# Patient Record
Sex: Female | Born: 1990 | Race: Black or African American | Hispanic: No | Marital: Single | Smoking: Never smoker
Health system: Southern US, Community
[De-identification: ages and names within clinical notes are randomized; demographics above are authoritative.]

## PROBLEM LIST (undated history)

## (undated) ENCOUNTER — Ambulatory Visit: Disposition: A | Discharge status: Home / Self Care | Payer: BLUE CROSS/BLUE SHIELD

## (undated) ENCOUNTER — Ambulatory Visit: Admission: EM | Payer: BC Managed Care – PPO | Source: Home / Self Care

## (undated) ENCOUNTER — Inpatient Hospital Stay (HOSPITAL_COMMUNITY): Payer: Self-pay

## (undated) DIAGNOSIS — E282 Polycystic ovarian syndrome: Secondary | ICD-10-CM

---

## 2012-07-21 ENCOUNTER — Ambulatory Visit (INDEPENDENT_AMBULATORY_CARE_PROVIDER_SITE_OTHER): Payer: BC Managed Care – PPO | Admitting: Family Medicine

## 2012-07-21 VITALS — BP 122/83 | HR 66 | Temp 98.2°F | Resp 16 | Ht 62.6 in | Wt 124.2 lb

## 2012-07-21 DIAGNOSIS — L738 Other specified follicular disorders: Secondary | ICD-10-CM

## 2012-07-21 DIAGNOSIS — Z3009 Encounter for other general counseling and advice on contraception: Secondary | ICD-10-CM

## 2012-07-21 DIAGNOSIS — R590 Localized enlarged lymph nodes: Secondary | ICD-10-CM

## 2012-07-21 DIAGNOSIS — R599 Enlarged lymph nodes, unspecified: Secondary | ICD-10-CM

## 2012-07-21 DIAGNOSIS — L739 Follicular disorder, unspecified: Secondary | ICD-10-CM

## 2012-07-21 DIAGNOSIS — L678 Other hair color and hair shaft abnormalities: Secondary | ICD-10-CM

## 2012-07-21 LAB — POCT URINE PREGNANCY: Preg Test, Ur: NEGATIVE

## 2012-07-21 MED ORDER — DOXYCYCLINE HYCLATE 100 MG PO TABS: 100.0000 mg | ORAL_TABLET | Freq: Two times a day (BID) | ORAL | Status: DC

## 2012-07-21 MED ORDER — NORETHINDRONE ACET-ETHINYL EST 1-20 MG-MCG PO TABS: 1.0000 | ORAL_TABLET | Freq: Every day | ORAL | Status: DC

## 2012-07-21 NOTE — Progress Notes (Signed)
[facility]:  Office Visit  Chief Complaint:  Chief Complaint  Patient presents with  . lumps on neck    HPI: Mackenzie Foley is a 22 y.o. female who complains of an infected bump on the middle of her neck which became infected about 3 days ago, was initially  a black head on neck for a long time, she then picked at it, then she also used a hair shaver for the little hairs on her chin and neck called the "finishing touch"  Without gels to shave the hair on her neck and this might have made the black head infected even more. Her mom was trying to pop it. There was pus when she tried to pop it. Denies fevers, chills. No prior skin infections. + pain, redness, purulent drainage.  She would also like OCP, she is sexually acitve. Uses condoms soemtimes. G0, no STDs that she knows of. One partner.   History reviewed. No pertinent past medical history. History reviewed. No pertinent past surgical history. History   Social History  . Marital Status: Single    Spouse Name: N/A    Number of Children: N/A  . Years of Education: N/A   Social History Main Topics  . Smoking status: Never Smoker   . Smokeless tobacco: None  . Alcohol Use: No  . Drug Use: No  . Sexually Active: Yes   Other Topics Concern  . None   Social History Narrative  . None   Family History  Problem Relation Age of Onset  . Hypertension Mother   . Hypertension Father    No Known Allergies Prior to Admission medications   Not on File     ROS: The patient denies fevers, chills, night sweats, unintentional weight loss, chest pain, palpitations, wheezing, dyspnea on exertion, nausea, vomiting, abdominal pain, dysuria, hematuria, melena, numbness, weakness, or tingling.  All other systems have been reviewed and were otherwise negative with the exception of those mentioned in the HPI and as above.    PHYSICAL EXAM: Filed Vitals:   07/21/12 1639  BP: 122/83  Pulse: 66  Temp: 98.2 F (36.8 C)   Resp: 16   Filed Vitals:   07/21/12 1639  Height: 5' 2.6" (1.59 m)  Weight: 124 lb 3.2 oz (56.337 kg)   Body mass index is 22.28 kg/(m^2).  General: Alert, no acute distress HEENT:  Normocephalic, atraumatic, oropharynx patent.  Cardiovascular:  Regular rate and rhythm, no rubs murmurs or gallops.  No Carotid bruits, radial pulse intact. No pedal edema.  Respiratory: Clear to auscultation bilaterally.  No wheezes, rales, or rhonchi.  No cyanosis, no use of accessory musculature GI: No organomegaly, abdomen is soft and non-tender, positive bowel sounds.  No masses. Skin:+ folliculitis on throat Neurologic: Facial musculature symmetric. Psychiatric: Patient is appropriate throughout our interaction. Lymphatic: + submandibular LAD 4-5 mm, + infected hair follicle on mid neck at [place] area, erythematous nodule, warm, tender, slightly fluctuant Musculoskeletal: Gait intact.   LABS: Results for orders placed in visit on 07/21/12  POCT URINE PREGNANCY      Result Value Range   Preg Test, Ur Negative       EKG/XRAY:   Primary read interpreted by Dr. Conley Rolls at [facility].   ASSESSMENT/PLAN: Encounter Diagnoses  Name Primary?  . Folliculitis Yes  . Lymphadenopathy, submandibular   . Birth control counseling    Rx Doxycycline 100 mg  BID x 10 D Warm compresses to infected follicle Additionally she needs f/u  for her LAD if it continues to be present once she has compelted her abx Rx LeEstrin for OCP, advise to take OCP with condom while on abx.  F/u in 1 year when she turns 22 for pap F/u prn     Jermall Isaacson PHUONG, DO 07/21/2012 5:57 PM

## 2012-08-19 ENCOUNTER — Ambulatory Visit (INDEPENDENT_AMBULATORY_CARE_PROVIDER_SITE_OTHER): Payer: BC Managed Care – PPO | Admitting: Emergency Medicine

## 2012-08-19 VITALS — BP 108/64 | HR 84 | Temp 98.6°F | Resp 16 | Ht 62.0 in | Wt 122.6 lb

## 2012-08-19 DIAGNOSIS — M25559 Pain in unspecified hip: Secondary | ICD-10-CM

## 2012-08-19 LAB — POCT UA - MICROSCOPIC ONLY
Casts, Ur, LPF, POC: NEGATIVE
Crystals, Ur, HPF, POC: NEGATIVE
Yeast, UA: NEGATIVE

## 2012-08-19 LAB — POCT URINALYSIS DIPSTICK
Nitrite, UA: NEGATIVE
Protein, UA: NEGATIVE
Spec Grav, UA: >=1.03
Urobilinogen, UA: 0.2

## 2012-08-19 NOTE — Patient Instructions (Signed)

## 2012-08-19 NOTE — Progress Notes (Signed)
[facility] [address], [place] [address] [phone]  Date:  08/19/2012   Name:  Mackenzie Foley   DOB:  May 14, 1991   MRN:  [id number]  PCP:  No primary provider on file.    Chief Complaint: Abdominal Pain   History of Present Illness:  Mackenzie Foley is a 22 y.o. very pleasant female patient who presents with the following:  Started on OCP 3 weeks ago.  Missed a pill and a week later had a heavy period.  She now is having premenstrual symptoms and only has one pill left.  She is concerned.  She has some low back pain.  No vaginal discharge or dyspareunia  There is no problem list on file for this patient.   History reviewed. No pertinent past medical history.  History reviewed. No pertinent past surgical history.  History  Substance Use Topics  . Smoking status: Never Smoker   . Smokeless tobacco: Not on file  . Alcohol Use: No    Family History  Problem Relation Age of Onset  . Hypertension Mother   . Hypertension Father     No Known Allergies  Medication list has been reviewed and updated.  Current Outpatient Prescriptions on File Prior to Visit  Medication Sig Dispense Refill  . norethindrone-ethinyl estradiol (MICROGESTIN,JUNEL,LOESTRIN) 1-20 MG-MCG tablet Take 1 tablet by mouth daily.  1 Package  11  . doxycycline (VIBRA-TABS) 100 MG tablet Take 1 tablet (100 mg total) by mouth 2 (two) times daily.  20 tablet  0   No current facility-administered medications on file prior to visit.    Review of Systems:  As per HPI, otherwise negative.    Physical Examination: Filed Vitals:   08/19/12 1709  BP: 108/64  Pulse: 84  Temp: 98.6 F (37 C)  Resp: 16   Filed Vitals:   08/19/12 1709  Height: 5\' 2"  (1.575 m)  Weight: 122 lb 9.6 oz (55.611 kg)   Body mass index is 22.42 kg/(m^2). Ideal Body Weight: Weight in (lb) to have BMI = 25: 136.4   GEN: WDWN, NAD, Non-toxic, Alert & Oriented x 3 HEENT: Atraumatic, Normocephalic.   Ears and Nose: No external deformity. EXTR: No clubbing/cyanosis/edema NEURO: Normal gait.  PSYCH: Normally interactive. Conversant. Not depressed or anxious appearing.  Calm demeanor.  Abdomen soft not tender  Assessment and Plan: Dysmenorrhea Counseled regarding contraceptive management   Signed,  Phillips Odor, MD   Results for orders placed in visit on 08/19/12  POCT URINE PREGNANCY      Result Value Range   Preg Test, Ur Negative    POCT UA - MICROSCOPIC ONLY      Result Value Range   WBC, Ur, HPF, POC 0-1     RBC, urine, microscopic 1-3     Bacteria, U Microscopic TRACE     Mucus, UA NEG     Epithelial cells, urine per micros 1-3     Crystals, Ur, HPF, POC NEG     Casts, Ur, LPF, POC NEG     Yeast, UA NEG    POCT URINALYSIS DIPSTICK      Result Value Range   Color, UA YELLOW     Clarity, UA CLOUDY     Glucose, UA NEG     Bilirubin, UA NEG     Ketones, UA NEG     Spec Grav, UA >=1.030     Blood, UA NEG     pH, UA 6.0  Protein, UA NEG     Urobilinogen, UA 0.2     Nitrite, UA NEG     Leukocytes, UA Trace

## 2012-11-13 ENCOUNTER — Encounter (HOSPITAL_COMMUNITY): Payer: Self-pay | Admitting: *Deleted

## 2012-11-13 ENCOUNTER — Ambulatory Visit (INDEPENDENT_AMBULATORY_CARE_PROVIDER_SITE_OTHER): Payer: BC Managed Care – PPO | Admitting: Family Medicine

## 2012-11-13 ENCOUNTER — Inpatient Hospital Stay (HOSPITAL_COMMUNITY)
Admission: AD | Admit: 2012-11-13 | Discharge: 2012-11-13 | Disposition: A | Discharge status: Home / Self Care | Payer: BC Managed Care – PPO | Source: Ambulatory Visit | Attending: Obstetrics & Gynecology | Admitting: Obstetrics & Gynecology

## 2012-11-13 ENCOUNTER — Inpatient Hospital Stay (HOSPITAL_COMMUNITY): Payer: BC Managed Care – PPO

## 2012-11-13 VITALS — BP 106/68 | HR 89 | Temp 98.6°F | Resp 16 | Ht 62.5 in | Wt 120.6 lb

## 2012-11-13 DIAGNOSIS — N920 Excessive and frequent menstruation with regular cycle: Secondary | ICD-10-CM

## 2012-11-13 DIAGNOSIS — O039 Complete or unspecified spontaneous abortion without complication: Secondary | ICD-10-CM

## 2012-11-13 LAB — POCT CBC
Granulocyte percent: 90.4 %G — AB (ref 37–80)
HCT, POC: 42.8 % (ref 37.7–47.9)
Hemoglobin: 13.3 g/dL (ref 12.2–16.2)
Lymph, poc: 1.3 (ref 0.6–3.4)
MCH, POC: 28.3 pg (ref 27–31.2)
MCHC: 31.1 g/dL — AB (ref 31.8–35.4)
MCV: 91.1 fL (ref 80–97)
MID (cbc): 0.4 (ref 0–0.9)
MPV: 11.9 fL (ref 0–99.8)
POC Granulocyte: 16.4 — AB (ref 2–6.9)
POC LYMPH PERCENT: 7.4 %L — AB (ref 10–50)
POC MID %: 2.2 %M (ref 0–12)
Platelet Count, POC: 208 10*3/uL (ref 142–424)
RBC: 4.7 M/uL (ref 4.04–5.48)
RDW, POC: 13.2 %
WBC: 18.1 10*3/uL — AB (ref 4.6–10.2)

## 2012-11-13 LAB — CBC
MCH: 28.2 pg (ref 26.0–34.0)
MCV: 85 fL (ref 78.0–100.0)
Platelets: 198 10*3/uL (ref 150–400)
RDW: 12.4 % (ref 11.5–15.5)

## 2012-11-13 LAB — POCT URINE PREGNANCY: Preg Test, Ur: POSITIVE

## 2012-11-13 MED ORDER — PROMETHAZINE HCL 12.5 MG PO TABS: 12.5000 mg | ORAL_TABLET | Freq: Four times a day (QID) | ORAL | Status: DC | PRN

## 2012-11-13 MED ORDER — OXYCODONE-ACETAMINOPHEN 5-325 MG PO TABS: 2.0000 | ORAL_TABLET | ORAL | Status: DC | PRN

## 2012-11-13 MED ORDER — MISOPROSTOL 200 MCG PO TABS: ORAL_TABLET | ORAL | Status: DC

## 2012-11-13 NOTE — Patient Instructions (Addendum)
Miscarriage A miscarriage is the sudden loss of an unborn baby (fetus) before the 20th week of pregnancy. Most miscarriages happen in the first 3 months of pregnancy. Sometimes, it happens before a woman even knows she is pregnant. A miscarriage is also called a "spontaneous miscarriage" or "early pregnancy loss." Having a miscarriage can be an emotional experience. Talk with your caregiver about any questions you may have about miscarrying, the grieving process, and your future pregnancy plans. CAUSES   Problems with the fetal chromosomes that make it impossible for the baby to develop normally. Problems with the baby's genes or chromosomes are most often the result of errors that occur, by chance, as the embryo divides and grows. The problems are not inherited from the parents.  Infection of the cervix or uterus.   Hormone problems.   Problems with the cervix, such as having an incompetent cervix. This is when the tissue in the cervix is not strong enough to hold the pregnancy.   Problems with the uterus, such as an abnormally shaped uterus, uterine fibroids, or congenital abnormalities.   Certain medical conditions.   Smoking, drinking alcohol, or taking illegal drugs.   Trauma.  Often, the cause of a miscarriage is unknown.  SYMPTOMS   Vaginal bleeding or spotting, with or without cramps or pain.  Pain or cramping in the abdomen or lower back.  Passing fluid, tissue, or blood clots from the vagina. DIAGNOSIS  Your caregiver will perform a physical exam. You may also have an ultrasound to confirm the miscarriage. Blood or urine tests may also be ordered. TREATMENT   Sometimes, treatment is not necessary if you naturally pass all the fetal tissue that was in the uterus. If some of the fetus or placenta remains in the body (incomplete miscarriage), tissue left behind may become infected and must be removed. Usually, a dilation and curettage (D and C) procedure is performed.  During a D and C procedure, the cervix is widened (dilated) and any remaining fetal or placental tissue is gently removed from the uterus.  Antibiotic medicines are prescribed if there is an infection. Other medicines may be given to reduce the size of the uterus (contract) if there is a lot of bleeding.  If you have Rh negative blood and your baby was Rh positive, you will need a Rh immunoglobulin shot. This shot will protect any future baby from having Rh blood problems in future pregnancies. HOME CARE INSTRUCTIONS   Your caregiver may order bed rest or may allow you to continue light activity. Resume activity as directed by your caregiver.  Have someone help with home and family responsibilities during this time.   Keep track of the number of sanitary pads you use each day and how soaked (saturated) they are. Write down this information.   Do not use tampons. Do not douche or have sexual intercourse until approved by your caregiver.   Only take over-the-counter or prescription medicines for pain or discomfort as directed by your caregiver.   Do not take aspirin. Aspirin can cause bleeding.   Keep all follow-up appointments with your caregiver.   If you or your partner have problems with grieving, talk to your caregiver or seek counseling to help cope with the pregnancy loss. Allow enough time to grieve before trying to get pregnant again.  SEEK IMMEDIATE MEDICAL CARE IF:   You have severe cramps or pain in your back or abdomen.  You have a fever.  You pass large blood clots (walnut-sized   or larger) ortissue from your vagina. Save any tissue for your caregiver to inspect.   Your bleeding increases.   You have a thick, bad-smelling vaginal discharge.  You become lightheaded, weak, or you faint.   You have chills.  MAKE SURE YOU:  Understand these instructions.  Will watch your condition.  Will get help right away if you are not doing well or get  worse. Document Released: 11/04/2000 Document Revised: 11/10/2011 Document Reviewed: 06/30/2011 Exit[employer] Patient Information 2014 [employer].  

## 2012-11-13 NOTE — MAU Note (Deleted)
[facility] called for transport. 

## 2012-11-13 NOTE — MAU Provider Note (Signed)
History     CSN: [id number]  Arrival date and time: 11/13/12 1537   First Provider Initiated Contact with Patient 11/13/12 1604      Chief Complaint  Patient presents with  . Threatened Miscarriage   HPI Ms. Mackenzie Foley is a 22 y.o. G1P0 at Unknown who was sent to MAU today from [facility] urgent care for evaluation of bleeding in early pregnancy. The patient states that she was not aware that she was pregnant prior to evaluation in urgent care today. She states moderate lower abdominal cramping off and on today. She started spotting on Friday. Bleeding became heavy today with clots. LMP was 10/11/12 per patient, however this period was shorter and lighter than usual.    OB History   Grav Para Term Preterm Abortions TAB SAB Ect Mult Living   1               History reviewed. No pertinent past medical history.  History reviewed. No pertinent past surgical history.  Family History  Problem Relation Age of Onset  . Hypertension Mother   . Hypertension Father     History  Substance Use Topics  . Smoking status: Never Smoker   . Smokeless tobacco: Not on file  . Alcohol Use: No    Allergies: No Known Allergies  Prescriptions prior to admission  Medication Sig Dispense Refill  . norethindrone-ethinyl estradiol (MICROGESTIN,JUNEL,LOESTRIN) 1-20 MG-MCG tablet Take 1 tablet by mouth daily.  1 Package  11    Review of Systems  Constitutional: Negative for fever and malaise/fatigue.  Gastrointestinal: Positive for abdominal pain. Negative for nausea and vomiting.  Genitourinary:       + vaginal bleeding  Neurological: Negative for dizziness, loss of consciousness and weakness.   Physical Exam   Blood pressure 126/75, pulse 86, temperature 98.4 F (36.9 C), temperature source Oral, resp. rate 18, height 5' 2.5" (1.588 m), weight 120 lb 6.4 oz (54.613 kg), last menstrual period 10/11/2012.  Physical Exam  Constitutional: She is oriented to person, place, and time. She appears  well-developed and well-nourished. No distress.  HENT:  Head: Normocephalic and atraumatic.  Cardiovascular: Normal rate, regular rhythm and normal heart sounds.   Respiratory: Effort normal and breath sounds normal. No respiratory distress.  GI: Soft. Bowel sounds are normal. She exhibits no distension and no mass. There is tenderness (mild tenderness to palpation of the lower abdomen). There is no rebound and no guarding.  Genitourinary: Uterus is enlarged. Uterus is not tender. Cervix exhibits no discharge and no friability. There is bleeding (small amount of bleeding noted. 8cm x 4 cm POC removed from vagina) around the vagina. No vaginal discharge found.  Neurological: She is alert and oriented to person, place, and time.  Skin: Skin is warm and dry. No erythema.  Psychiatric: She has a normal mood and affect.   Results for orders placed during the hospital encounter of 11/13/12 (from the past 24 hour(s))  WET PREP, GENITAL     Status: Abnormal   Collection Time    11/13/12  4:30 PM      Result Value Range   Yeast Wet Prep HPF POC NONE SEEN  NONE SEEN   Trich, Wet Prep NONE SEEN  NONE SEEN   Clue Cells Wet Prep HPF POC NONE SEEN  NONE SEEN   WBC, Wet Prep HPF POC FEW (*) NONE SEEN  CBC     Status: Abnormal   Collection Time    11/13/12  4:45 PM  Result Value Range   WBC 17.6 (*) 4.0 - 10.5 K/uL   RBC 4.26  3.87 - 5.11 MIL/uL   Hemoglobin 12.0  12.0 - 15.0 g/dL   HCT 16.1  09.6 - 04.5 %   MCV 85.0  78.0 - 100.0 fL   MCH 28.2  26.0 - 34.0 pg   MCHC 33.1  30.0 - 36.0 g/dL   RDW 40.9  81.1 - 91.4 %   Platelets 198  150 - 400 K/uL  HCG, QUANTITATIVE, PREGNANCY     Status: Abnormal   Collection Time    11/13/12  4:45 PM      Result Value Range   hCG, Beta Chain, Quant, S 860 (*) <5 mIU/mL  ABO/RH     Status: None   Collection Time    11/13/12  4:45 PM      Result Value Range   ABO/RH(D) O POS      MAU Course  Procedures None  MDM Discussed [place] results with Dr.  Penne Lash. Offer Cytotec and follow-up in clinic in 2-3 weeks. Discuss bleeding precautions POC sent to pathology  Assessment and Plan  A: SAB  P: Discharge home Rx for Cytotec, Percocet and phenergan given/sent to patient's pharmacy Discussed bleeding precautions Patient referred to [facility] clinic for follow-up in 2-3 weeks Patient may return to MAU as needed or if her condition were to change or worsen  Freddi Starr, PA-C  11/13/2012, 4:04 PM

## 2012-11-13 NOTE — MAU Note (Signed)
Pt seen at urgent care for vag bleeding. Told to come to MAU she may be having a miscarriage. Pt reports some cramping on and off.Marland Kitchen

## 2012-11-13 NOTE — Progress Notes (Signed)
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 She has had increasing pain as well today. No fever. She's been sexually active and not using contraception. She says that the period came on at the time she expected, the last period being this time last month. She's had no loss of consciousness or one-sided pain.  Objective: No acute distress Vaginal exam reveals large amount of violaceous tissue issuing forth from the cervix. The area was swabbed and no active bleeding was noted. She did not have cervical motion tenderness and only mild abdominal tenderness.    Results for orders placed in visit on 11/13/12  POCT URINE PREGNANCY      Result Value Range   Preg Test, Ur Positive    POCT CBC      Result Value Range   WBC 18.1 (*) 4.6 - 10.2 K/uL   Lymph, poc 1.3  0.6 - 3.4   POC LYMPH PERCENT 7.4 (*) 10 - 50 %L   MID (cbc) 0.4  0 - 0.9   POC MID % 2.2  0 - 12 %M   POC Granulocyte 16.4 (*) 2 - 6.9   Granulocyte percent 90.4 (*) 37 - 80 %G   RBC 4.70  4.04 - 5.48 M/uL   Hemoglobin 13.3  12.2 - 16.2 g/dL   HCT, POC 13.0  86.5 - 47.9 %   MCV 91.1  80 - 97 fL   MCH, POC 28.3  27 - 31.2 pg   MCHC 31.1 (*) 31.8 - 35.4 g/dL   RDW, POC 78.4     Platelet Count, POC 208  142 - 424 K/uL   MPV 11.9  0 - 99.8 fL   Assessment: Spontaneous abortion in progress, patient stable  Plan[facility] who is present in the exam room take the patient.  Signed, Sheila Oats.D.

## 2012-11-14 LAB — GC/CHLAMYDIA PROBE AMP
CT Probe RNA: NEGATIVE
GC Probe RNA: NEGATIVE

## 2012-11-18 ENCOUNTER — Encounter: Payer: Self-pay | Admitting: Family

## 2012-11-21 NOTE — MAU Provider Note (Signed)
POC on pathology Attestation of Attending Supervision of Advanced Practitioner (CNM/NP): Evaluation and management procedures were performed by the Advanced Practitioner under my supervision and collaboration. I have reviewed the Advanced Practitioner's note and chart, and I agree with the management and plan.  Cooper Moroney H. 10:11 PM

## 2012-11-30 ENCOUNTER — Ambulatory Visit (INDEPENDENT_AMBULATORY_CARE_PROVIDER_SITE_OTHER): Payer: BC Managed Care – PPO | Admitting: Family

## 2012-11-30 ENCOUNTER — Encounter: Payer: Self-pay | Admitting: Family

## 2012-11-30 VITALS — BP 108/74 | HR 60 | Temp 97.4°F | Ht 62.0 in | Wt 123.0 lb

## 2012-11-30 DIAGNOSIS — O039 Complete or unspecified spontaneous abortion without complication: Secondary | ICD-10-CM

## 2012-11-30 NOTE — Progress Notes (Signed)
  Subjective:    Patient ID: Hanah Moultry, female    DOB: 10/04/1990, 22 y.o.   MRN: [id number]  HPI Pt is here status post a SAB.  Seen in MAU on 11/13/12 and large POC removed from vaginal vault.  Sent home on Cytotec.  Reports bleeding stopped one week after cytotec.  No problems or concerns today.  Desires to be on ocps.  Reports has not been sexually active since seen in MAU.   Review of Systems Pertinent info in HPI    Objective:   Physical Exam  Constitutional: She is oriented to person, place, and time. She appears well-developed and well-nourished. No distress.  HENT:  Head: Normocephalic and atraumatic.  Neck: Normal range of motion. Neck supple.  Cardiovascular: Normal rate, regular rhythm and normal heart sounds.   Pulmonary/Chest: Effort normal and breath sounds normal. No respiratory distress.  Abdominal: Soft. Bowel sounds are normal. There is no tenderness.  Uterus involuted  Genitourinary: No bleeding around the vagina.  Neurological: She is alert and oriented to person, place, and time.  Skin: Skin is warm and dry.     Report:  Pathology > 11/13/12 showed products of conception.     Assessment & Plan:  Miscarriage   Plan: Obtain BHCG  RX Loestrin FE 1/20  [facility]

## 2012-12-01 NOTE — Progress Notes (Signed)
BHCG level 10 > repeat in one week.

## 2013-08-05 ENCOUNTER — Ambulatory Visit: Payer: Self-pay

## 2013-08-05 DIAGNOSIS — N898 Other specified noninflammatory disorders of vagina: Secondary | ICD-10-CM

## 2013-08-05 DIAGNOSIS — A599 Trichomoniasis, unspecified: Secondary | ICD-10-CM

## 2013-08-05 DIAGNOSIS — Z202 Contact with and (suspected) exposure to infections with a predominantly sexual mode of transmission: Secondary | ICD-10-CM

## 2014-03-26 ENCOUNTER — Encounter: Payer: Self-pay | Admitting: Family

## 2014-03-30 ENCOUNTER — Other Ambulatory Visit: Payer: Self-pay | Admitting: Obstetrics and Gynecology

## 2014-05-11 ENCOUNTER — Other Ambulatory Visit: Payer: Self-pay | Admitting: Obstetrics and Gynecology

## 2014-05-29 IMAGING — US US OB COMP LESS 14 WK
1 series · 14 of 28 positions shown · non-contrast
Comparison: none

CLINICAL DATA: Vaginal bleeding and abdominal pain

OBSTETRIC <14 WK ULTRASOUND, TRANSVAGINAL OB US
TECHNIQUE: Transabdominal and transvaginal ultrasound was
performed for evaluation of the gestation as well as the maternal
uterus and adnexal regions.

[Series 1: us ob comp less 14 wks · 14 of 31 slices shown]
[im 2/31]
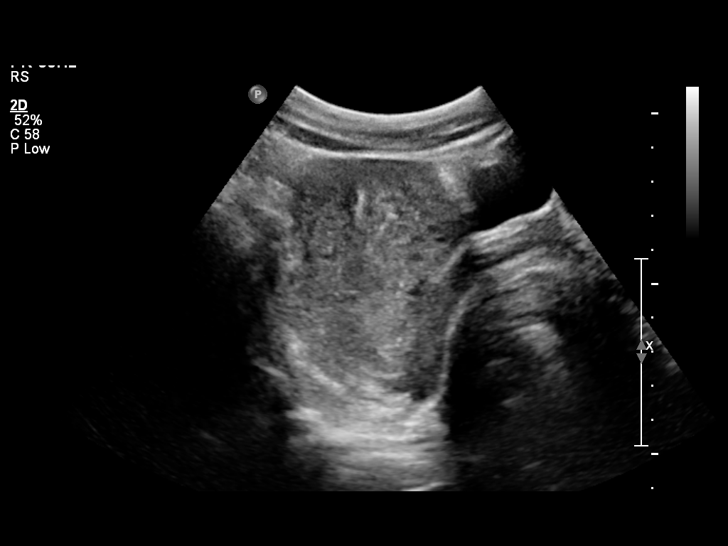
[im 4/31]
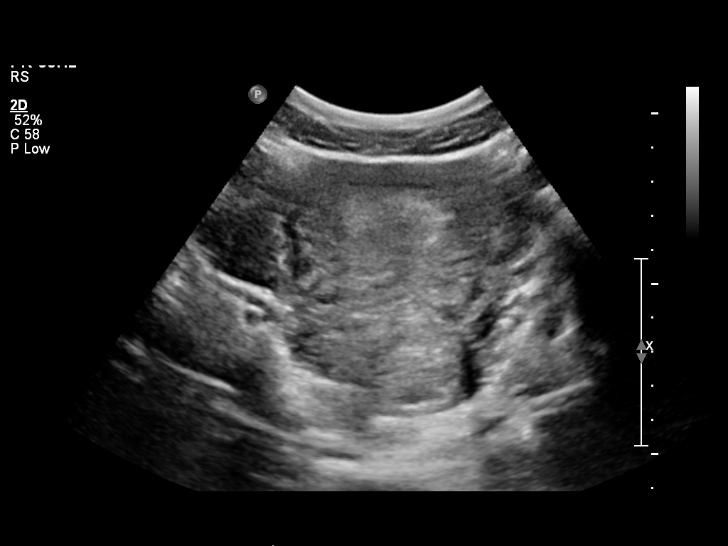
[im 6/31]
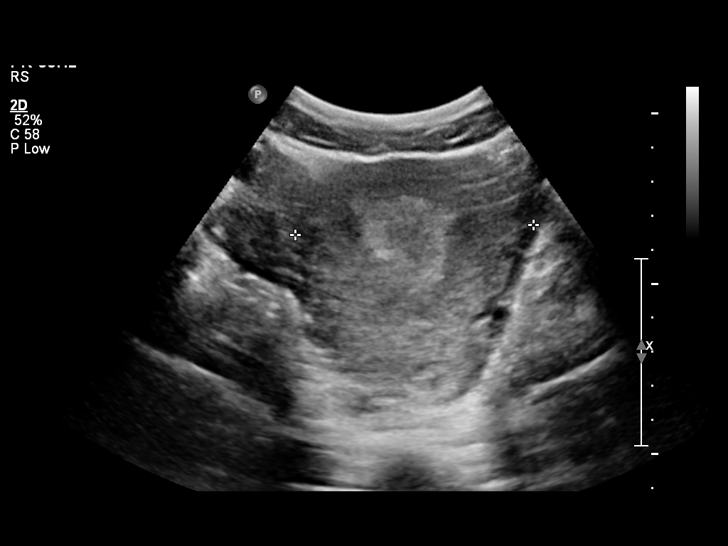
[im 8/31]
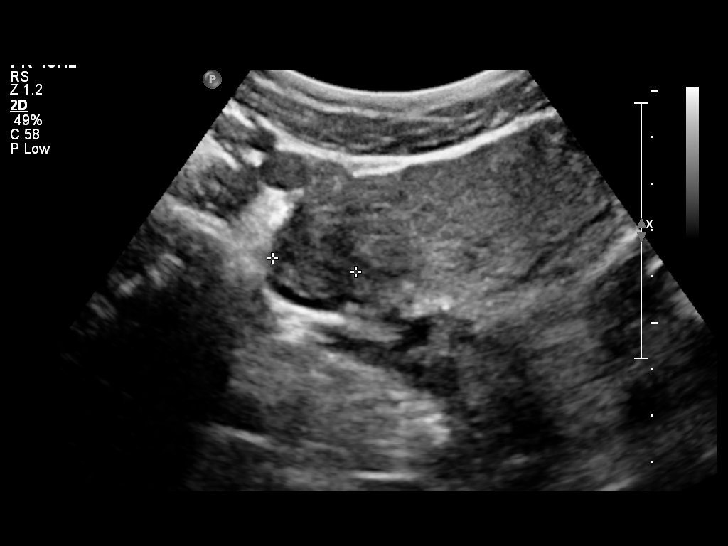
[im 11/31]
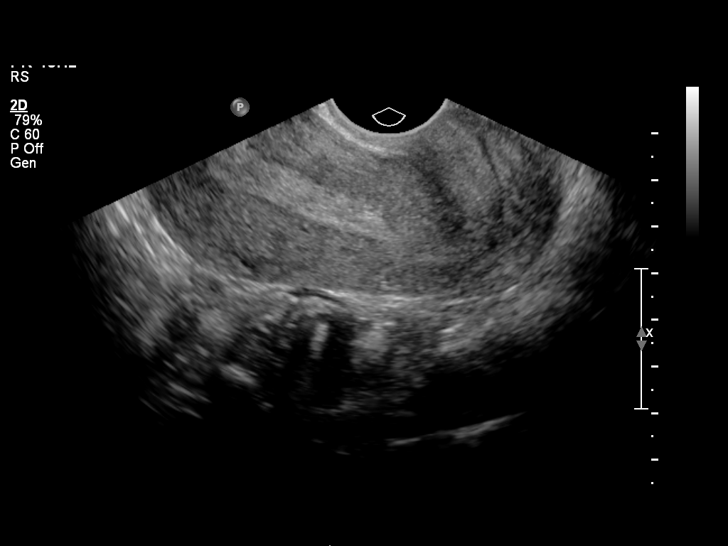
[im 13/31]
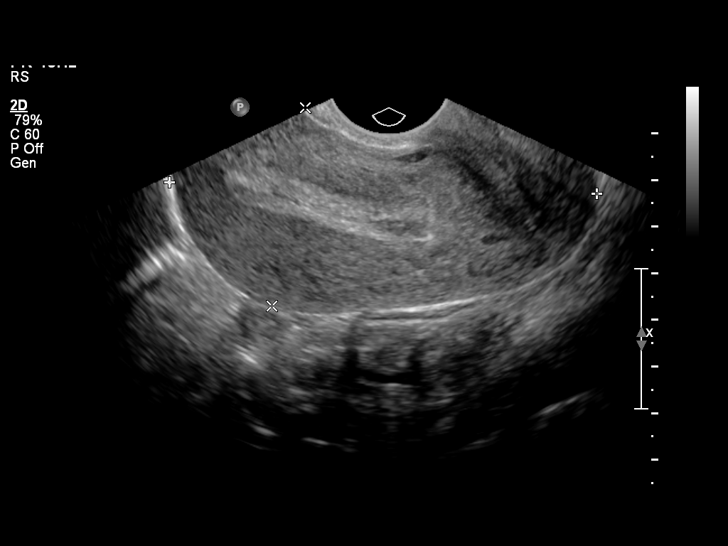
[im 15/31]
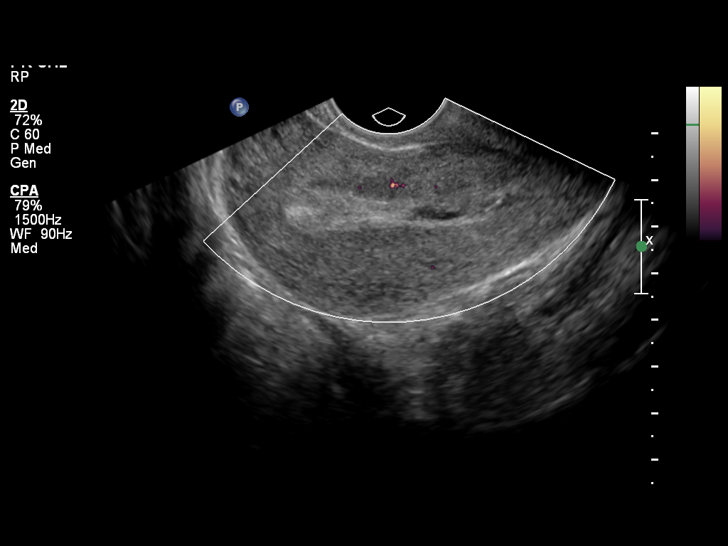
[im 17/31]
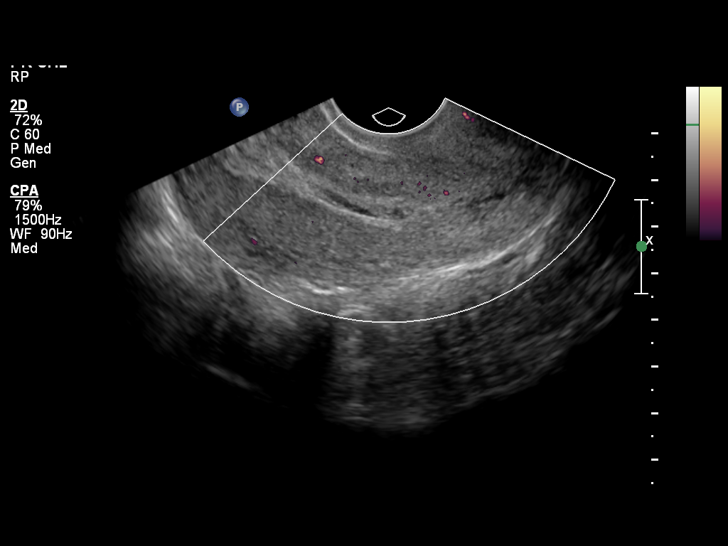
[im 19/31]
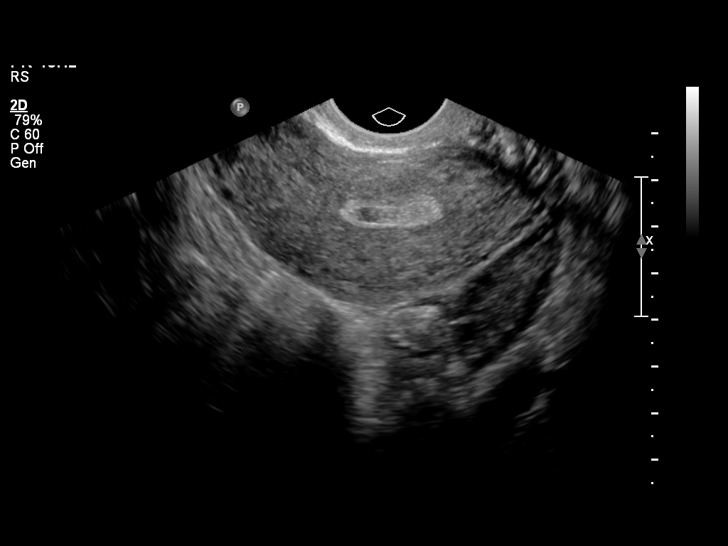
[im 22/31]
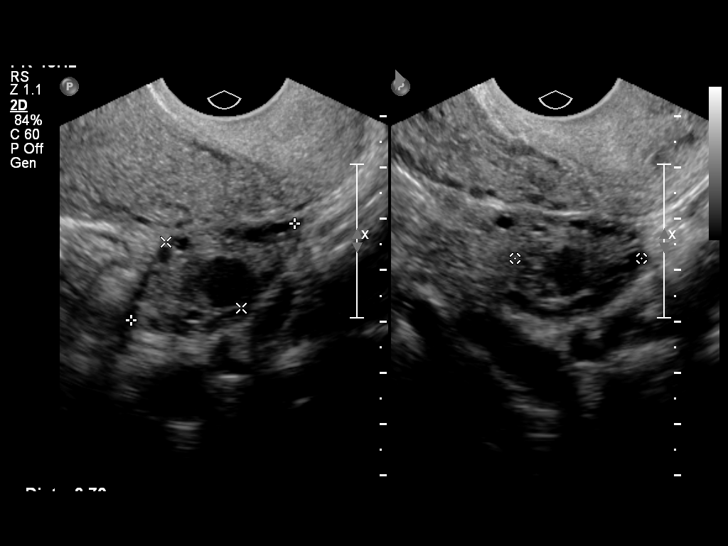
[im 24/31]
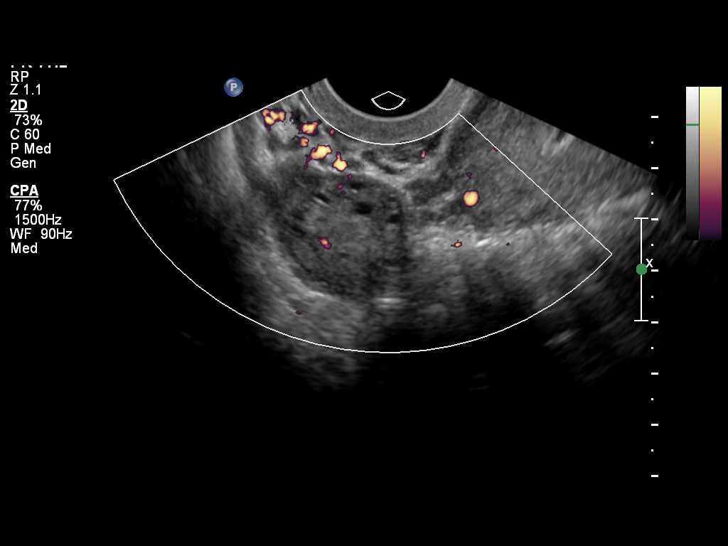
[im 26/31]
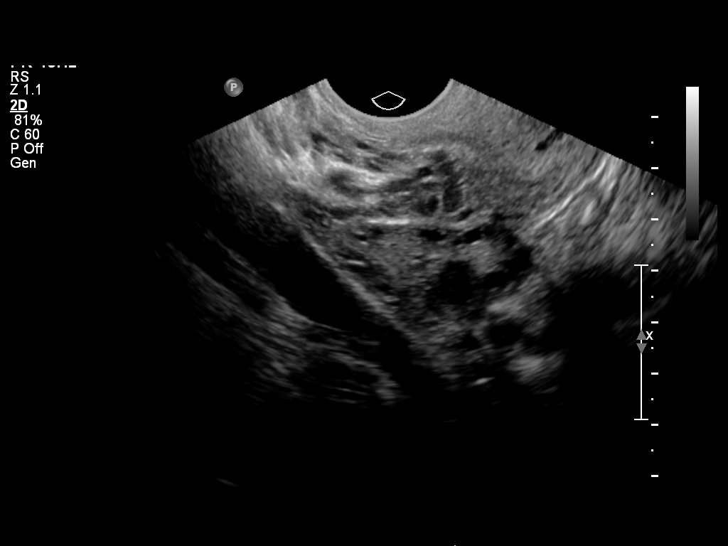
[im 28/31]
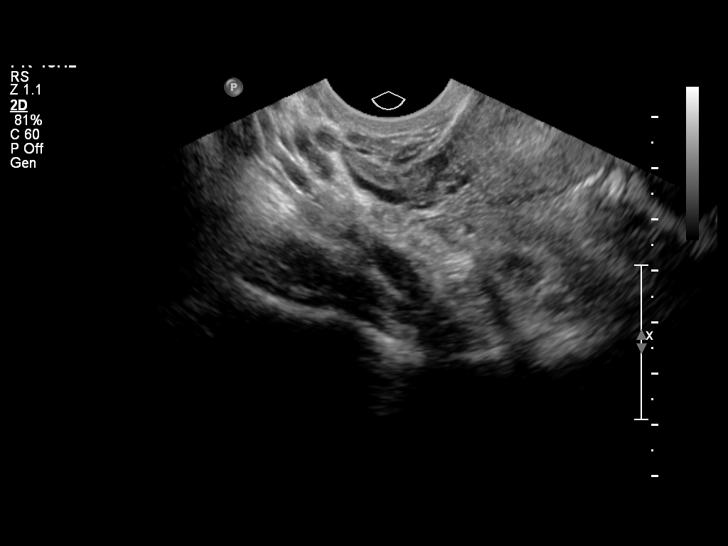
[im 31/31]
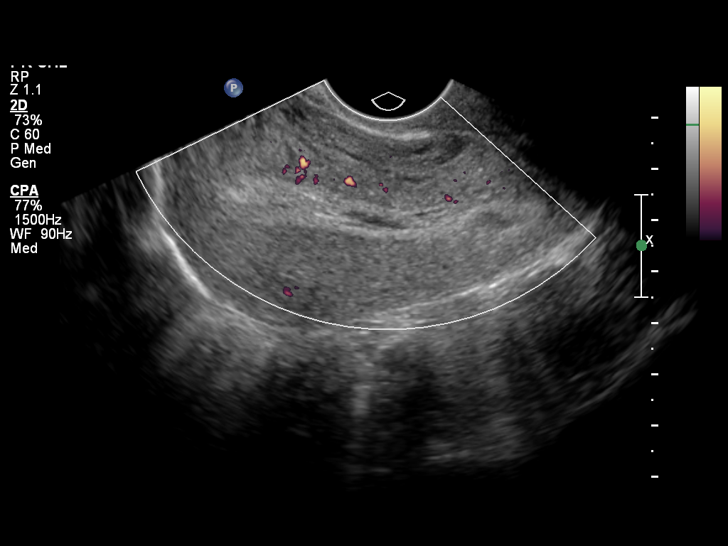

[14 of 28 positions shown; findings below may reference images not displayed]

FINDINGS: No intrauterine gestation is identified.

The endometrium appears irregular measuring up to 8 mm.  No areas
of abnormal vascularity noted.

Maternal uterus/adnexae:
The ovaries both appear normal.
IMPRESSION: 1.  No intrauterine gestational sac identified.
2.  The endometrium measures 8 mm and is mildly complex with areas
of increased and decreased echogenicity..

## 2014-06-27 ENCOUNTER — Other Ambulatory Visit: Payer: Self-pay

## 2014-06-29 LAB — CYTOLOGY - PAP

## 2014-08-01 ENCOUNTER — Other Ambulatory Visit: Payer: Self-pay

## 2014-08-30 ENCOUNTER — Other Ambulatory Visit: Payer: Self-pay

## 2015-03-26 ENCOUNTER — Other Ambulatory Visit: Payer: Self-pay

## 2015-05-26 DIAGNOSIS — E282 Polycystic ovarian syndrome: Secondary | ICD-10-CM

## 2015-05-26 HISTORY — DX: Polycystic ovarian syndrome: E28.2

## 2016-08-23 ENCOUNTER — Encounter (HOSPITAL_COMMUNITY): Payer: Self-pay | Admitting: Emergency Medicine

## 2016-08-23 DIAGNOSIS — R1084 Generalized abdominal pain: Secondary | ICD-10-CM | POA: Insufficient documentation

## 2016-08-23 DIAGNOSIS — A09 Infectious gastroenteritis and colitis, unspecified: Secondary | ICD-10-CM | POA: Diagnosis not present

## 2016-08-23 DIAGNOSIS — R1013 Epigastric pain: Secondary | ICD-10-CM | POA: Diagnosis present

## 2016-08-23 LAB — CBC WITH DIFFERENTIAL/PLATELET
Basophils Absolute: 0 10*3/uL (ref 0.0–0.1)
Basophils Relative: 0 %
EOS PCT: 0 %
Eosinophils Absolute: 0 10*3/uL (ref 0.0–0.7)
HEMATOCRIT: 40.1 % (ref 36.0–46.0)
HEMOGLOBIN: 13.2 g/dL (ref 12.0–15.0)
LYMPHS PCT: 6 %
Lymphs Abs: 1.1 10*3/uL (ref 0.7–4.0)
MCH: 26.9 pg (ref 26.0–34.0)
MCHC: 32.9 g/dL (ref 30.0–36.0)
MCV: 81.7 fL (ref 78.0–100.0)
Monocytes Absolute: 1 10*3/uL (ref 0.1–1.0)
Monocytes Relative: 6 %
Neutro Abs: 14.6 10*3/uL — ABNORMAL HIGH (ref 1.7–7.7)
Neutrophils Relative %: 88 %
Platelets: 280 10*3/uL (ref 150–400)
RBC: 4.91 MIL/uL (ref 3.87–5.11)
RDW: 12.6 % (ref 11.5–15.5)
WBC: 16.7 10*3/uL — AB (ref 4.0–10.5)

## 2016-08-23 LAB — I-STAT BETA HCG BLOOD, ED (MC, WL, AP ONLY): I-stat hCG, quantitative: <5 m[IU]/mL (ref <5)

## 2016-08-23 NOTE — ED Triage Notes (Signed)
Patient complaining of abdominal pain that started today. Patient states it feels like someone is stabbing her in the middle of her abdomen. Patient states that she also feels nauseas.

## 2016-08-24 ENCOUNTER — Emergency Department (HOSPITAL_COMMUNITY)
Admission: EM | Admit: 2016-08-24 | Discharge: 2016-08-24 | Disposition: A | Discharge status: Home / Self Care | Payer: BLUE CROSS/BLUE SHIELD | Attending: Emergency Medicine | Admitting: Emergency Medicine

## 2016-08-24 DIAGNOSIS — R1084 Generalized abdominal pain: Secondary | ICD-10-CM

## 2016-08-24 DIAGNOSIS — R197 Diarrhea, unspecified: Secondary | ICD-10-CM

## 2016-08-24 LAB — URINALYSIS, ROUTINE W REFLEX MICROSCOPIC
BILIRUBIN URINE: NEGATIVE
GLUCOSE, UA: NEGATIVE mg/dL
Ketones, ur: NEGATIVE mg/dL
LEUKOCYTES UA: NEGATIVE
Nitrite: NEGATIVE
PH: 5 (ref 5.0–8.0)
Protein, ur: NEGATIVE mg/dL
SPECIFIC GRAVITY, URINE: 1.027 (ref 1.005–1.030)

## 2016-08-24 LAB — COMPREHENSIVE METABOLIC PANEL
ALBUMIN: 4.5 g/dL (ref 3.5–5.0)
ALK PHOS: 81 U/L (ref 38–126)
ALT: 29 U/L (ref 14–54)
ANION GAP: 7 (ref 5–15)
AST: 26 U/L (ref 15–41)
BUN: 16 mg/dL (ref 6–20)
CALCIUM: 9.5 mg/dL (ref 8.9–10.3)
CO2: 27 mmol/L (ref 22–32)
Chloride: 103 mmol/L (ref 101–111)
Creatinine, Ser: 0.86 mg/dL (ref 0.44–1.00)
GFR calc Af Amer: >60 mL/min (ref >60)
GLUCOSE: 130 mg/dL — AB (ref 65–99)
POTASSIUM: 4 mmol/L (ref 3.5–5.1)
Sodium: 137 mmol/L (ref 135–145)
TOTAL PROTEIN: 8.1 g/dL (ref 6.5–8.1)
Total Bilirubin: 0.8 mg/dL (ref 0.3–1.2)

## 2016-08-24 LAB — LIPASE, BLOOD: Lipase: 24 U/L (ref 11–51)

## 2016-08-24 MED ORDER — IBUPROFEN 600 MG PO TABS: 600.0000 mg | ORAL_TABLET | Freq: Four times a day (QID) | ORAL | 0 refills | Status: AC | PRN

## 2016-08-24 MED ORDER — HYDROCODONE-ACETAMINOPHEN 5-325 MG PO TABS
1.0000 | ORAL_TABLET | Freq: Once | ORAL | Status: AC
  Administered 2016-08-24: 1 via ORAL
  Filled 2016-08-24: qty 1

## 2016-08-24 MED ORDER — IBUPROFEN 200 MG PO TABS
600.0000 mg | ORAL_TABLET | Freq: Once | ORAL | Status: AC
  Administered 2016-08-24: 600 mg via ORAL
  Filled 2016-08-24: qty 3

## 2016-08-24 NOTE — ED Provider Notes (Signed)
[facility]-EMERGENCY DEPT Provider Note   CSN: [id number] Arrival date & time: 08/23/16  2259   By signing my name below, I, Clarisse Gouge, attest that this documentation has been prepared under the direction and in the presence of Zadie Rhine, MD. Electronically signed, Clarisse Gouge, ED Scribe. 08/24/16. 12:55 AM.   History   Chief Complaint Chief Complaint  Patient presents with  . Abdominal Pain   The history is provided by the patient and medical records. No language interpreter was used.  Abdominal Pain   This is a recurrent problem. The current episode started 6 to 12 hours ago. The problem has not changed since onset.The pain is associated with an unknown factor. The pain is located in the epigastric region. The quality of the pain is sharp. The pain is at a severity of 6/10. The pain is moderate. Associated symptoms include diarrhea and nausea. Pertinent negatives include fever, vomiting, dysuria and hematuria. The symptoms are aggravated by certain positions.    HPI Comments: Mackenzie Foley is a 26 y.o. female who presents to the Emergency Department complaining of severe, stabbing upper abdominal pain intermittently since ~4 PM yesterday. Pt notes no radiation of pain, though she describes her pain as "across the top" of her abdomen and she states she stood up from a seated position at the onset of her pain. She notes associated nausea, diaphoresis, diarrhea and palpitations. She is visiting from out of town, and she states she was [facility] for severe pelvic and rectal pain 2 weeks ago where she was diagnosed with a cyst on the L ovary. She notes diagnosis of PCOS last year. Pt denies dysuria, cough, fever and abdominal surgeries. LNMP reportedly ended 08/20/2016.   PMH - PCOS OB History    Gravida Para Term Preterm AB Living   1 0 0 0 1 0   SAB TAB Ectopic Multiple Live Births   1 0 0 0         Home Medications    Prior to Admission medications   Medication Sig Start Date  End Date Taking? Authorizing Provider  acetaminophen (TYLENOL) 500 MG tablet Take 1,000 mg by mouth every 6 (six) hours as needed for pain.    Historical Provider, MD  ibuprofen (ADVIL,MOTRIN) 200 MG tablet Take 600 mg by mouth every 6 (six) hours as needed for pain or headache.    Historical Provider, MD  misoprostol (CYTOTEC) 200 MCG tablet Place 4 tabs (800 mcg) in the vagina once 11/13/12   Marny Lowenstein, PA-C  Naproxen Sodium (ALEVE PO) Take 2 tablets by mouth daily as needed (headache).    Historical Provider, MD  oxyCODONE-acetaminophen (PERCOCET/ROXICET) 5-325 MG per tablet Take 2 tablets by mouth every 4 (four) hours as needed for pain. 11/13/12   Marny Lowenstein, PA-C  promethazine (PHENERGAN) 12.5 MG tablet Take 1 tablet (12.5 mg total) by mouth every 6 (six) hours as needed for nausea. 11/13/12   Marny Lowenstein, PA-C    Family History Family History  Problem Relation Age of Onset  . Hypertension Mother   . Hypertension Father     Social History Social History  Substance Use Topics  . Smoking status: Never Smoker  . Smokeless tobacco: Never Used  . Alcohol use No     Allergies   Fish allergy and Shellfish allergy   Review of Systems Review of Systems  Constitutional: Positive for diaphoresis. Negative for fever.  Cardiovascular: Positive for palpitations.  Gastrointestinal: Positive for abdominal pain, diarrhea and  nausea. Negative for vomiting.  Genitourinary: Negative for dysuria and hematuria.  All other systems reviewed and are negative.    Physical Exam Updated Vital Signs BP 137/87 (BP Location: Right Arm)   Pulse 94   Temp 98.6 F (37 C) (Oral)   Resp 12   Ht  (1.575 m)   Wt 160 lb (72.6 kg)   LMP 08/19/2016   SpO2 99%   BMI 29.26 kg/m   Physical Exam CONSTITUTIONAL: Well developed/well nourished HEAD: Normocephalic/atraumatic EYES: EOMI/PERRL ENMT: Mucous membranes moist NECK: supple no meningeal signs SPINE/BACK:entire spine  nontender CV: S1/S2 noted, no murmurs/rubs/gallops noted LUNGS: Lungs are clear to auscultation bilaterally, no apparent distress ABDOMEN: soft, nontender, no rebound or guarding, bowel sounds noted throughout abdomen GU:no cva tenderness NEURO: Pt is awake/alert/appropriate, moves all extremitiesx4.  No facial droop.   EXTREMITIES: pulses normal/equal, full ROM SKIN: warm, color normal PSYCH: no abnormalities of mood noted, alert and oriented to situation   ED Treatments / Results  DIAGNOSTIC STUDIES: Oxygen Saturation is 99% on RA, normal by my interpretation.    COORDINATION OF CARE: 12:48 AM Discussed treatment plan with pt at bedside and pt agreed to plan. Will order medication, review labs and reassess pt.  Labs (all labs ordered are listed, but only abnormal results are displayed) Labs Reviewed  COMPREHENSIVE METABOLIC PANEL - Abnormal; Notable for the following:       Result Value   Glucose, Bld 130 (*)    All other components within normal limits  URINALYSIS, ROUTINE W REFLEX MICROSCOPIC - Abnormal; Notable for the following:    APPearance CLOUDY (*)    Hgb urine dipstick SMALL (*)    Bacteria, UA RARE (*)    Squamous Epithelial / LPF 6-30 (*)    All other components within normal limits  CBC WITH DIFFERENTIAL/PLATELET - Abnormal; Notable for the following:    WBC 16.7 (*)    Neutro Abs 14.6 (*)    All other components within normal limits  LIPASE, BLOOD  I-STAT BETA HCG BLOOD, ED ([facility], [facility], [facility] ONLY)    EKG  EKG Interpretation None       Radiology No results found.  Procedures Procedures (including critical care time)  Medications Ordered in ED Medications  ibuprofen (ADVIL,MOTRIN) tablet 600 mg (600 mg Oral Given 08/24/16 0056)  HYDROcodone-acetaminophen (NORCO/VICODIN) 5-325 MG per tablet 1 tablet (1 tablet Oral Given 08/24/16 0215)     Initial Impression / Assessment and Plan / ED Course  I have reviewed the triage vital signs and the nursing  notes.  Pertinent labs  results that were available during my care of the patient were reviewed by me and considered in my medical decision making (see chart for details).     Pt well appearing No distress No focal ABD tenderness on exam No RUQ tenderness No RLQ tenderness She does have elevated WBC but no other abnormalities My suspicion for appendicitis is low No RUQ tenderness to suggest cholelithiasis Given h/o OV cyst, she may have had small rupture that caused pain, but she has no LLQ/RLQ tenderness  We discussed strict ER return precautions Pt prefers to go home rather than have any imaging at this time   Final Clinical Impressions(s) / ED Diagnoses   Final diagnoses:  Generalized abdominal pain  Diarrhea of presumed infectious origin    New Prescriptions New Prescriptions   No medications on file   I personally performed the services described in this documentation, which was scribed in  my presence. The recorded information has been reviewed and is accurate.        Zadie Rhine, MD 08/24/16 [phone]

## 2016-08-24 NOTE — ED Notes (Signed)
ED Provider at bedside. 

## 2016-08-24 NOTE — ED Notes (Signed)
Pt ambulatory to restroom

## 2016-08-24 NOTE — ED Notes (Signed)
Pt ambulatory and independent at discharge.  Verbalized understanding of discharge instructions 

## 2018-08-11 ENCOUNTER — Emergency Department (HOSPITAL_BASED_OUTPATIENT_CLINIC_OR_DEPARTMENT_OTHER)
Admission: EM | Admit: 2018-08-11 | Discharge: 2018-08-11 | Disposition: A | Discharge status: Home / Self Care | Payer: BLUE CROSS/BLUE SHIELD | Attending: Emergency Medicine | Admitting: Emergency Medicine

## 2018-08-11 ENCOUNTER — Encounter (HOSPITAL_BASED_OUTPATIENT_CLINIC_OR_DEPARTMENT_OTHER): Payer: Self-pay | Admitting: *Deleted

## 2018-08-11 ENCOUNTER — Emergency Department (HOSPITAL_BASED_OUTPATIENT_CLINIC_OR_DEPARTMENT_OTHER): Payer: BLUE CROSS/BLUE SHIELD

## 2018-08-11 ENCOUNTER — Other Ambulatory Visit: Payer: Self-pay

## 2018-08-11 DIAGNOSIS — N939 Abnormal uterine and vaginal bleeding, unspecified: Secondary | ICD-10-CM | POA: Insufficient documentation

## 2018-08-11 HISTORY — DX: Polycystic ovarian syndrome: E28.2

## 2018-08-11 LAB — URINALYSIS, ROUTINE W REFLEX MICROSCOPIC
Bilirubin Urine: NEGATIVE
GLUCOSE, UA: NEGATIVE mg/dL
Ketones, ur: 15 mg/dL — AB
LEUKOCYTE UA: NEGATIVE
NITRITE: NEGATIVE
Protein, ur: NEGATIVE mg/dL
Specific Gravity, Urine: 1.025 (ref 1.005–1.030)
pH: 6 (ref 5.0–8.0)

## 2018-08-11 LAB — WET PREP, GENITAL
Sperm: NONE SEEN
Trich, Wet Prep: NONE SEEN
Yeast Wet Prep HPF POC: NONE SEEN

## 2018-08-11 LAB — URINALYSIS, MICROSCOPIC (REFLEX)

## 2018-08-11 LAB — PREGNANCY, URINE: PREG TEST UR: NEGATIVE

## 2018-08-11 NOTE — ED Triage Notes (Signed)
Lower abdominal pain x 3 days. Vaginal bleeding on and off since January. She has a hx of Polycystic Ovarian Syndrome. Heavier vaginal bleeding x 5 days. She has been taking Ibuprofen for pain.

## 2018-08-11 NOTE — ED Provider Notes (Signed)
[facility] EMERGENCY DEPARTMENT Provider Note   CSN: [id number] Arrival date & time: 08/11/18  1059    History   Chief Complaint Chief Complaint  Patient presents with   Vaginal Bleeding    HPI Mackenzie Foley is a 28 y.o. female presenting for evaluation of vaginal bleeding and lower abdominal pain.  Patient states she has had intermittent spotting since the end of December, 3 months ago.  Her vaginal bleeding stopped last week, but then resumed very heavily 4 days ago.  Along with the heavier bleeding, she developed of your suprapubic pain.  This is improved with ibuprofen/Aleve for several hours, before pain returns.  Today, pain was the worst it has been.  Patient states it is a stabbing pain.  It is located centrally, not off to 1 side.  She denies fevers, chills, chest pain, cough, nausea, vomiting, upper abdominal pain, urinary symptoms, normal bowel movements.  Patient reports slight relief of suprapubic pain when having a bowel movement, as if pressure has been released.  No change with urination.  Patient is sexually active with one female partner who is symptom-free.  Patient denies vaginal discharge.  Patient reports a history of PCOS, has had large cysts which have ruptured in the past.  She is not sure if this feels similar.  She states she has no other medical problems, takes medications daily.  She denies dizziness or lightheadedness.     HPI  Past Medical History:  Diagnosis Date   Polycystic ovarian syndrome 2017    There are no active problems to display for this patient.   History reviewed. No pertinent surgical history.   OB History    Gravida  1   Para  0   Term  0   Preterm  0   AB  1   Living  0     SAB  1   TAB  0   Ectopic  0   Multiple  0   Live Births               Home Medications    Prior to Admission medications   Medication Sig Start Date End Date Taking? Authorizing Provider  ibuprofen (ADVIL,MOTRIN) 600 MG  tablet Take 1 tablet (600 mg total) by mouth every 6 (six) hours as needed. 08/24/16   Zadie Rhine, MD    Family History Family History  Problem Relation Age of Onset   Hypertension Mother    Hypertension Father     Social History Social History   Tobacco Use   Smoking status: Never Smoker   Smokeless tobacco: Never Used  Substance Use Topics   Alcohol use: No   Drug use: No     Allergies   Fish allergy and Shellfish allergy   Review of Systems Review of Systems  Genitourinary: Positive for pelvic pain and vaginal bleeding.  All other systems reviewed and are negative.    Physical Exam Updated Vital Signs BP 127/84    Pulse 81    Temp 98.2 F (36.8 C) (Oral)    Resp 16    Ht  (1.575 m)    Wt 85.2 kg    LMP 08/06/2018    SpO2 98%    BMI 34.35 kg/m   Physical Exam Vitals signs and nursing note reviewed. Exam conducted with a chaperone present.  Constitutional:      General: She is not in acute distress.    Appearance: She is well-developed.  Comments: Resting comfortably in the bed in no acute distress  HENT:     Head: Normocephalic and atraumatic.  Eyes:     Conjunctiva/sclera: Conjunctivae normal.     Pupils: Pupils are equal, round, and reactive to light.  Neck:     Musculoskeletal: Normal range of motion and neck supple.  Cardiovascular:     Rate and Rhythm: Normal rate and regular rhythm.  Pulmonary:     Effort: Pulmonary effort is normal. No respiratory distress.     Breath sounds: Normal breath sounds. No wheezing.  Abdominal:     General: There is no distension.     Palpations: Abdomen is soft. There is no mass.     Tenderness: There is no abdominal tenderness. There is no guarding or rebound.     Comments: No tenderness palpation the abdomen.  Soft without rigidity, guarding, distention.  Negative rebound.  Genitourinary:    Exam position: Supine.     Vagina: Normal.     Cervix: Cervical bleeding present.     Uterus: Not  tender.      Adnexa: Right adnexa normal and left adnexa normal.     Comments: Dark red blood noted coming from the cervix with small, dime size clots.  No CMT or adnexal tenderness.  No discharge noted. Musculoskeletal: Normal range of motion.  Skin:    General: Skin is warm and dry.     Capillary Refill: Capillary refill takes less than 2 seconds.  Neurological:     Mental Status: She is alert and oriented to person, place, and time.      ED Treatments / Results  Labs (all labs ordered are listed, but only abnormal results are displayed) Labs Reviewed  WET PREP, GENITAL - Abnormal; Notable for the following components:      Result Value   Clue Cells Wet Prep HPF POC PRESENT (*)    WBC, Wet Prep HPF POC FEW (*)    All other components within normal limits  URINALYSIS, ROUTINE W REFLEX MICROSCOPIC - Abnormal; Notable for the following components:   APPearance CLOUDY (*)    Hgb urine dipstick LARGE (*)    Ketones, ur 15 (*)    All other components within normal limits  URINALYSIS, MICROSCOPIC (REFLEX) - Abnormal; Notable for the following components:   Bacteria, UA FEW (*)    All other components within normal limits  PREGNANCY, URINE  GC/CHLAMYDIA PROBE AMP ([place]) NOT AT [facility]    EKG None  Radiology US Transvaginal Non-ob  Result Date: 08/11/2018 CLINICAL DATA:  Severe lower abdomen pain for 3 days. EXAM: TRANSABDOMINAL AND TRANSVAGINAL ULTRASOUND OF PELVIS DOPPLER ULTRASOUND OF OVARIES TECHNIQUE: Both transabdominal and transvaginal ultrasound examinations of the pelvis were performed. Transabdominal technique was performed for global imaging of the pelvis including uterus, ovaries, adnexal regions, and pelvic cul-de-sac. It was necessary to proceed with endovaginal exam following the transabdominal exam to visualize the ovaries and endometrium. Color and duplex Doppler ultrasound was utilized to evaluate blood flow to the ovaries. COMPARISON:  None. FINDINGS: Uterus  Measurements: 6.6 x 3.3 x 4.5 cm = volume: 51.6 mL. No fibroids or other mass visualized. Endometrium Thickness: 8.4 mm. The endometrium is inhomogeneous. There is a question focal hyperechoic lesion in the endometrium measuring 1.7 x 0.7 x 1 cm. Right ovary Measurements: 3.5 x 2.2 x 3.1 cm = volume: 12.2 mL. Normal appearance/no adnexal mass. Left ovary Measurements: 3.5 x 2.1 x 3 cm = volume: 11.4 mL. Normal appearance/no adnexal  mass. Pulsed Doppler evaluation of both ovaries demonstrates normal low-resistance arterial and venous waveforms. Other findings Trace free fluid is noted in the pelvis. IMPRESSION: No evidence of ovarian torsion. Trace free fluid in the pelvis. Slight inhomogeneous endometrium, nonspecific. Question focal hyperechoic lesion in the endometrium measuring 1.7 x 0.7 x 1 cm. Electronically Signed   By: Sherian Rein M.D.   On: 08/11/2018 13:11   US Pelvis Complete  Result Date: 08/11/2018 CLINICAL DATA:  Severe lower abdomen pain for 3 days. EXAM: TRANSABDOMINAL AND TRANSVAGINAL ULTRASOUND OF PELVIS DOPPLER ULTRASOUND OF OVARIES TECHNIQUE: Both transabdominal and transvaginal ultrasound examinations of the pelvis were performed. Transabdominal technique was performed for global imaging of the pelvis including uterus, ovaries, adnexal regions, and pelvic cul-de-sac. It was necessary to proceed with endovaginal exam following the transabdominal exam to visualize the ovaries and endometrium. Color and duplex Doppler ultrasound was utilized to evaluate blood flow to the ovaries. COMPARISON:  None. FINDINGS: Uterus Measurements: 6.6 x 3.3 x 4.5 cm = volume: 51.6 mL. No fibroids or other mass visualized. Endometrium Thickness: 8.4 mm. The endometrium is inhomogeneous. There is a question focal hyperechoic lesion in the endometrium measuring 1.7 x 0.7 x 1 cm. Right ovary Measurements: 3.5 x 2.2 x 3.1 cm = volume: 12.2 mL. Normal appearance/no adnexal mass. Left ovary Measurements: 3.5 x 2.1 x 3  cm = volume: 11.4 mL. Normal appearance/no adnexal mass. Pulsed Doppler evaluation of both ovaries demonstrates normal low-resistance arterial and venous waveforms. Other findings Trace free fluid is noted in the pelvis. IMPRESSION: No evidence of ovarian torsion. Trace free fluid in the pelvis. Slight inhomogeneous endometrium, nonspecific. Question focal hyperechoic lesion in the endometrium measuring 1.7 x 0.7 x 1 cm. Electronically Signed   By: Sherian Rein M.D.   On: 08/11/2018 13:11   [place] Art/ven Flow Abd Pelv Doppler  Result Date: 08/11/2018 CLINICAL DATA:  Severe lower abdomen pain for 3 days. EXAM: TRANSABDOMINAL AND TRANSVAGINAL ULTRASOUND OF PELVIS DOPPLER ULTRASOUND OF OVARIES TECHNIQUE: Both transabdominal and transvaginal ultrasound examinations of the pelvis were performed. Transabdominal technique was performed for global imaging of the pelvis including uterus, ovaries, adnexal regions, and pelvic cul-de-sac. It was necessary to proceed with endovaginal exam following the transabdominal exam to visualize the ovaries and endometrium. Color and duplex Doppler ultrasound was utilized to evaluate blood flow to the ovaries. COMPARISON:  None. FINDINGS: Uterus Measurements: 6.6 x 3.3 x 4.5 cm = volume: 51.6 mL. No fibroids or other mass visualized. Endometrium Thickness: 8.4 mm. The endometrium is inhomogeneous. There is a question focal hyperechoic lesion in the endometrium measuring 1.7 x 0.7 x 1 cm. Right ovary Measurements: 3.5 x 2.2 x 3.1 cm = volume: 12.2 mL. Normal appearance/no adnexal mass. Left ovary Measurements: 3.5 x 2.1 x 3 cm = volume: 11.4 mL. Normal appearance/no adnexal mass. Pulsed Doppler evaluation of both ovaries demonstrates normal low-resistance arterial and venous waveforms. Other findings Trace free fluid is noted in the pelvis. IMPRESSION: No evidence of ovarian torsion. Trace free fluid in the pelvis. Slight inhomogeneous endometrium, nonspecific. Question focal hyperechoic  lesion in the endometrium measuring 1.7 x 0.7 x 1 cm. Electronically Signed   By: Sherian Rein M.D.   On: 08/11/2018 13:11    Procedures Procedures (including critical care time)  Medications Ordered in ED Medications - No data to display   Initial Impression / Assessment and Plan / ED Course  I have reviewed the triage vital signs and the nursing notes.  Pertinent labs &  imaging results that were available during my care of the patient were reviewed by me and considered in my medical decision making (see chart for details).        Patient presenting for evaluation of vaginal bleeding and suprapubic pain.  Physical exam reassuring, she is afebrile not tachycardic.  Appears nontoxic.  No dizziness or lightheadedness, doubt anemia.  Additionally, abdominal exam is reassuring, no tenderness palpation.  Pelvic exam with blood, but no CMT or adnexal tenderness.  However, considering patient's history of PCOS and history of cyst in the past, will obtain pelvic ultrasound to rule out large cyst, torsion, or mass.  Reviewed ultrasound results.  Thickened endometrial wall with heterogeneous appearance.  Discussed with radiologist, Dr. Juel Burrow, who believes results are likely due to a blood clot in the uterus, and recommends follow-up ultrasound in several weeks with OB/GYN to ensure resolution.  Does not believe this is consistent with infection, fibroids, or cancer.  Discussed findings with patient.  Discussed continued symptomatic treatment with Tylenol, ibuprofen, heating pads.  Encouraged follow-up with OB/GYN.  At this time, patient appears safe for discharge.  Return precautions given.  Patient states she understands and agrees plan.   Final Clinical Impressions(s) / ED Diagnoses   Final diagnoses:  Vaginal bleeding    ED Discharge Orders    None       Alveria Apley, PA-C 08/11/18 1657    Sabas Sous, MD 08/12/18 [phone]

## 2018-08-11 NOTE — Discharge Instructions (Addendum)
Take ibuprofen 3 times a day with meals or aleve 2 times a day.  Do not take other anti-inflammatories at the same time (Advil, Motrin). You may supplement with Tylenol if you need further pain control. Use heating pads for pain control.  Follow-up with your OB/GYN if your symptoms not improving by next week.  Follow-up with OB GYN for follow-up ultrasound to make sure the blood clot has gone. Return to the emergency room with any new, worsening, concerning symptoms.

## 2018-08-11 NOTE — ED Notes (Signed)
ED Provider at bedside. 

## 2018-08-13 LAB — GC/CHLAMYDIA PROBE AMP (~~LOC~~) NOT AT ARMC
Chlamydia: NEGATIVE
Neisseria Gonorrhea: NEGATIVE

## 2019-02-28 ENCOUNTER — Encounter: Payer: Self-pay | Admitting: *Deleted

## 2019-03-06 ENCOUNTER — Encounter: Payer: Self-pay | Admitting: *Deleted

## 2022-01-05 ENCOUNTER — Ambulatory Visit
Admission: EM | Admit: 2022-01-05 | Discharge: 2022-01-05 | Disposition: A | Discharge status: Home / Self Care | Payer: BC Managed Care – PPO | Attending: Physician Assistant | Admitting: Physician Assistant

## 2022-01-05 DIAGNOSIS — N760 Acute vaginitis: Secondary | ICD-10-CM | POA: Diagnosis present

## 2022-01-05 DIAGNOSIS — B3731 Acute candidiasis of vulva and vagina: Secondary | ICD-10-CM | POA: Insufficient documentation

## 2022-01-05 NOTE — ED Provider Notes (Signed)
[facility]    CSN: [id number] Arrival date & time: 01/05/22  1754      History   Chief Complaint Chief Complaint  Patient presents with   Vaginitis    HPI Mackenzie Foley is a 31 y.o. female.   Patient here today for evaluation of vaginal irritation.  She states that recently she has BV but did not take all of her medication and is concerned BV has returned.  She denies any rashes or genital lesions.  She does not report any other symptoms.  The history is provided by the patient.    Past Medical History:  Diagnosis Date   Polycystic ovarian syndrome 2017    There are no problems to display for this patient.   No past surgical history on file.  OB History     Gravida  1   Para  0   Term  0   Preterm  0   AB  1   Living  0      SAB  1   IAB  0   Ectopic  0   Multiple  0   Live Births               Home Medications    Prior to Admission medications   Medication Sig Start Date End Date Taking? Authorizing Provider  ibuprofen (ADVIL,MOTRIN) 600 MG tablet Take 1 tablet (600 mg total) by mouth every 6 (six) hours as needed. 08/24/16   Zadie Rhine, MD    Family History Family History  Problem Relation Age of Onset   Hypertension Mother    Hypertension Father     Social History Social History   Tobacco Use   Smoking status: Never   Smokeless tobacco: Never  Vaping Use   Vaping Use: Never used  Substance Use Topics   Alcohol use: No   Drug use: No     Allergies   Fish allergy and Shellfish allergy   Review of Systems Review of Systems  Constitutional:  Negative for chills and fever.  Eyes:  Negative for discharge and redness.  Gastrointestinal:  Negative for abdominal pain, nausea and vomiting.  Genitourinary:  Positive for vaginal discharge.     Physical Exam Triage Vital Signs ED Triage Vitals [01/05/22 1900]  Enc Vitals Group     BP      Pulse      Resp      Temp      Temp src      SpO2       Weight      Height      Head Circumference      Peak Flow      Pain Score 4     Pain Loc      Pain Edu?      Excl. in GC?    No data found.  Updated Vital Signs BP (!) 145/82   Pulse 71   Temp 98.7 F (37.1 C)   Resp 18   SpO2 98%      Physical Exam Vitals and nursing note reviewed.  Constitutional:      General: She is not in acute distress.    Appearance: Normal appearance. She is not ill-appearing.  HENT:     Head: Normocephalic and atraumatic.  Eyes:     Conjunctiva/sclera: Conjunctivae normal.  Cardiovascular:     Rate and Rhythm: Normal rate.  Pulmonary:     Effort: Pulmonary effort is normal.  Neurological:  Mental Status: She is alert.  Psychiatric:        Mood and Affect: Mood normal.        Behavior: Behavior normal.        Thought Content: Thought content normal.      UC Treatments / Results  Labs (all labs ordered are listed, but only abnormal results are displayed) Labs Reviewed  CERVICOVAGINAL ANCILLARY ONLY    EKG   Radiology No results found.  Procedures Procedures (including critical care time)  Medications Ordered in UC Medications - No data to display  Initial Impression / Assessment and Plan / UC Course  I have reviewed the triage vital signs and the nursing notes.  Pertinent labs & imaging results that were available during my care of the patient were reviewed by me and considered in my medical decision making (see chart for details).   STD, BV, yeast screening ordered.  Will await results further recommendation.  Final Clinical Impressions(s) / UC Diagnoses   Final diagnoses:  Acute vaginitis   Discharge Instructions   None    ED Prescriptions   None    PDMP not reviewed this encounter.   Tomi Bamberger, PA-C 01/06/22 [phone]

## 2022-01-05 NOTE — ED Triage Notes (Signed)
[facility] with [facility] of vaginal irritation pt recently tested positive for bv in [place]  and was treated with metronidazole but after treatments symptoms returned.

## 2022-01-09 ENCOUNTER — Telehealth: Payer: Self-pay

## 2022-01-09 ENCOUNTER — Ambulatory Visit
Admission: EM | Admit: 2022-01-09 | Discharge: 2022-01-09 | Disposition: A | Discharge status: Home / Self Care | Payer: BC Managed Care – PPO | Attending: Physician Assistant | Admitting: Physician Assistant

## 2022-01-09 DIAGNOSIS — B3731 Acute candidiasis of vulva and vagina: Secondary | ICD-10-CM | POA: Diagnosis not present

## 2022-01-09 DIAGNOSIS — N760 Acute vaginitis: Secondary | ICD-10-CM | POA: Insufficient documentation

## 2022-01-09 NOTE — ED Notes (Signed)
Pt presents for recollect cyto swab

## 2022-01-12 LAB — CERVICOVAGINAL ANCILLARY ONLY
Bacterial Vaginitis (gardnerella): POSITIVE — AB
Candida Glabrata: NEGATIVE
Candida Vaginitis: POSITIVE — AB
Chlamydia: NEGATIVE
Comment: NEGATIVE
Comment: NEGATIVE
Comment: NEGATIVE
Comment: NEGATIVE
Comment: NEGATIVE
Comment: NORMAL
Neisseria Gonorrhea: NEGATIVE
Trichomonas: NEGATIVE

## 2022-01-13 ENCOUNTER — Telehealth (HOSPITAL_COMMUNITY): Payer: Self-pay | Admitting: Emergency Medicine

## 2022-01-13 LAB — CERVICOVAGINAL ANCILLARY ONLY
Bacterial Vaginitis (gardnerella): NEGATIVE
Candida Glabrata: NEGATIVE
Candida Vaginitis: POSITIVE — AB
Chlamydia: NEGATIVE
Comment: NEGATIVE
Comment: NEGATIVE
Comment: NEGATIVE
Comment: NEGATIVE
Comment: NEGATIVE
Comment: NORMAL
Neisseria Gonorrhea: NEGATIVE
Trichomonas: NEGATIVE

## 2022-01-13 MED ORDER — METRONIDAZOLE 500 MG PO TABS: 500.0000 mg | ORAL_TABLET | Freq: Two times a day (BID) | ORAL | 0 refills | Status: AC

## 2022-01-13 MED ORDER — FLUCONAZOLE 150 MG PO TABS: 150.0000 mg | ORAL_TABLET | Freq: Once | ORAL | 0 refills | Status: DC

## 2022-01-13 MED ORDER — METRONIDAZOLE 500 MG PO TABS: 500.0000 mg | ORAL_TABLET | Freq: Two times a day (BID) | ORAL | 0 refills | Status: DC

## 2022-01-13 MED ORDER — FLUCONAZOLE 150 MG PO TABS: 150.0000 mg | ORAL_TABLET | Freq: Once | ORAL | 0 refills | Status: AC

## 2022-01-13 NOTE — Telephone Encounter (Signed)
Patient needed a different pharmacy
